# Patient Record
Sex: Male | Born: 1990 | Race: White | Hispanic: No | Marital: Single | Smoking: Never smoker
Health system: Southern US, Community
[De-identification: ages and names within clinical notes are randomized; demographics above are authoritative.]

## PROBLEM LIST (undated history)

## (undated) DIAGNOSIS — F419 Anxiety disorder, unspecified: Secondary | ICD-10-CM

---

## 2015-12-20 ENCOUNTER — Emergency Department (HOSPITAL_COMMUNITY)
Admission: EM | Admit: 2015-12-20 | Discharge: 2015-12-20 | Disposition: A | Discharge status: Home / Self Care | Payer: Self-pay | Attending: Emergency Medicine | Admitting: Emergency Medicine

## 2015-12-20 ENCOUNTER — Encounter (HOSPITAL_COMMUNITY): Payer: Self-pay | Admitting: *Deleted

## 2015-12-20 ENCOUNTER — Emergency Department (HOSPITAL_COMMUNITY): Payer: Self-pay

## 2015-12-20 DIAGNOSIS — N201 Calculus of ureter: Secondary | ICD-10-CM

## 2015-12-20 HISTORY — DX: Anxiety disorder, unspecified: F41.9

## 2015-12-20 MED ORDER — KETOROLAC TROMETHAMINE 30 MG/ML IJ SOLN
30.0000 mg | Freq: Once | INTRAMUSCULAR | Status: AC
  Administered 2015-12-20: 30 mg via INTRAVENOUS
  Filled 2015-12-20: qty 1

## 2015-12-20 MED ORDER — MORPHINE SULFATE (PF) 4 MG/ML IV SOLN
4.0000 mg | Freq: Once | INTRAVENOUS | Status: AC
  Administered 2015-12-20: 4 mg via INTRAVENOUS
  Filled 2015-12-20: qty 1

## 2015-12-20 MED ORDER — ONDANSETRON 4 MG PO TBDP
4.0000 mg | ORAL_TABLET | Freq: Three times a day (TID) | ORAL | 0 refills | Status: AC | PRN
Start: 2015-12-20 — End: ?

## 2015-12-20 MED ORDER — NAPROXEN 500 MG PO TABS: ORAL_TABLET | ORAL | 0 refills | Status: AC

## 2015-12-20 MED ORDER — FENTANYL CITRATE (PF) 100 MCG/2ML IJ SOLN
50.0000 ug | Freq: Once | INTRAMUSCULAR | Status: AC
  Administered 2015-12-20: 50 ug via INTRAVENOUS
  Filled 2015-12-20: qty 2

## 2015-12-20 MED ORDER — METOCLOPRAMIDE HCL 5 MG/ML IJ SOLN
10.0000 mg | Freq: Once | INTRAMUSCULAR | Status: AC
  Administered 2015-12-20: 10 mg via INTRAVENOUS
  Filled 2015-12-20: qty 2

## 2015-12-20 MED ORDER — DIPHENHYDRAMINE HCL 50 MG/ML IJ SOLN
25.0000 mg | Freq: Once | INTRAMUSCULAR | Status: AC
  Administered 2015-12-20: 25 mg via INTRAVENOUS
  Filled 2015-12-20: qty 1

## 2015-12-20 MED ORDER — ONDANSETRON HCL 4 MG/2ML IJ SOLN
4.0000 mg | Freq: Once | INTRAMUSCULAR | Status: AC
  Administered 2015-12-20: 4 mg via INTRAVENOUS

## 2015-12-20 MED ORDER — ONDANSETRON HCL 4 MG/2ML IJ SOLN
INTRAMUSCULAR | Status: AC
  Administered 2015-12-20: 4 mg
  Filled 2015-12-20: qty 2

## 2015-12-20 MED ORDER — ONDANSETRON HCL 4 MG/2ML IJ SOLN
4.0000 mg | Freq: Once | INTRAMUSCULAR | Status: AC
  Administered 2015-12-20: 4 mg via INTRAVENOUS
  Filled 2015-12-20: qty 2

## 2015-12-20 NOTE — ED Notes (Signed)
Victor Soto with CT at desk states pt is asking for pain medication; Dr. Lynelle DoctorKnapp informed and pt's mother informed of wait for order for pain medication

## 2015-12-20 NOTE — ED Triage Notes (Signed)
Pt c/o left flank pain that woke him up from sleep x 1 hour ago; pt states he is having trouble urinating

## 2015-12-20 NOTE — ED Provider Notes (Signed)
[facility]PT Provider Note   CSN: [id number] Arrival date & time: 12/20/15  0433  Time seen 05:20 AM   History   Chief Complaint Chief Complaint  Patient presents with  . Back Pain    HPI Victor Soto is a 25 y.o. male.  HPI patient reports he was awakened from sleep acutely about 3 AM with pain in his left flank area. He describes the pain as "painful" or cramping. He states it hurts with movement and urination. Nothing makes it feel better. He has had nausea and vomiting. He denies hematuria or dysuria. He states sometimes he has pain in his mid abdomen. He states he's never had this pain before. He denies any change in his activity. Mother states his maternal aunt has a history of kidney stones.  [place]  Past Medical History:  Diagnosis Date  . Anxiety     There are no active problems to display for this patient.   History reviewed. No pertinent surgical history.     Home Medications   Anxiety medication that starts with a C or a P Prior to Admission medications   Medication Sig Start Date End Date Taking? Authorizing Provider  naproxen (NAPROSYN) 500 MG tablet Take 1 po BID with food prn pain 12/20/15   Devoria AlbeIva Natelie Ostrosky, MD  ondansetron (ZOFRAN ODT) 4 MG disintegrating tablet Take 1 tablet (4 mg total) by mouth every 8 (eight) hours as needed for nausea or vomiting. 12/20/15   Devoria AlbeIva Shailen Thielen, MD    Family History History reviewed. No pertinent family history.  Social History Social History  Substance Use Topics  . Smoking status: Never Smoker  . Smokeless tobacco: Never Used  . Alcohol use Yes     Comment: occasionally  employed  Allergies   Penicillins   Review of Systems Review of Systems  All other systems reviewed and are negative.    Physical Exam Updated Vital Signs BP 147/97 (BP Location: Left Arm)   Pulse 100   Temp 97.5 F (36.4 C) (Oral)   Resp 18   Ht 5\' 11"  (1.803 m)   SpO2 100%   Vital signs normal    Physical  Exam  Constitutional: He is oriented to person, place, and time. He appears well-developed and well-nourished.  Non-toxic appearance. He does not appear ill. He appears distressed.  HENT:  Head: Normocephalic and atraumatic.  Right Ear: External ear normal.  Left Ear: External ear normal.  Nose: Nose normal. No mucosal edema or rhinorrhea.  Mouth/Throat: Oropharynx is clear and moist and mucous membranes are normal. No dental abscesses or uvula swelling.  Eyes: Conjunctivae and EOM are normal. Pupils are equal, round, and reactive to light.  Neck: Normal range of motion and full passive range of motion without pain. Neck supple.  Cardiovascular: Normal rate, regular rhythm and normal heart sounds.  Exam reveals no gallop and no friction rub.   No murmur heard. Pulmonary/Chest: Effort normal and breath sounds normal. No respiratory distress. He has no wheezes. He has no rhonchi. He has no rales. He exhibits no tenderness and no crepitus.  Abdominal: Soft. Normal appearance and bowel sounds are normal. He exhibits no distension. There is no tenderness. There is no rebound and no guarding.  Genitourinary:  Genitourinary Comments: + Left CVA pain  Musculoskeletal: Normal range of motion. He exhibits no edema or tenderness.  Moves all extremities well.   Neurological: He is alert and oriented to person, place, and time. He has normal  strength. No cranial nerve deficit.  Skin: Skin is warm, dry and intact. No rash noted. No erythema.  Psychiatric: He has a normal mood and affect. His speech is normal and behavior is normal. His mood appears not anxious.  Nursing note and vitals reviewed.    ED Treatments / Results  Labs (all labs ordered are listed, but only abnormal results are displayed) Labs Reviewed - No data to display  EKG  EKG Interpretation None       Radiology Ct Renal Stone Study  Result Date: 12/20/2015 CLINICAL DATA:  Left lower quadrant pain radiating to the left flank.  Difficulty urinating. EXAM: CT ABDOMEN AND PELVIS WITHOUT CONTRAST TECHNIQUE: Multidetector CT imaging of the abdomen and pelvis was performed following the standard protocol without IV contrast. COMPARISON:  None. FINDINGS: Lower chest: No pulmonary nodules or pleural effusion. No visible pericardial effusion. Hepatobiliary: Normal noncontrast appearance of the liver. No visible biliary dilatation. Normal gallbladder. Pancreas: Normal noncontrast appearance of the pancreas. No peripancreatic fluid collection. Spleen: Normal. Adrenal glands: Normal. Urinary Tract: --Right kidney: No hydronephrosis or perinephric stranding. No nephrolithiasis. No obstructing ureteral stones. --Left kidney: There is pelviectasis versus mild hydronephrosis. The left ureter is mildly dilated. No obstructing stone is identified. --Urinary bladder: 2 mm stone within the dependent portion of the urinary bladder may be a recently passed calculus. Stomach/Bowel: No dilated loops of bowel. No evidence of colonic or enteric inflammation. No fluid collection within the abdomen. Vascular/Lymphatic: No abdominal aortic aneurysm or atherosclerotic calcification. No abdominal or pelvic lymphadenopathy. Reproductive: Normal prostate and seminal vesicles. Musculoskeletal. No focal osseous lesion. Normal visualized extraperitoneal and extrathoracic soft tissues. IMPRESSION: 1. 2 mm calculus within the urinary bladder may be a recently passed stone, particularly given the mild left ureteral and renal pelvic dilatation. 2. No other nephrolithiasis. Electronically Signed   By: Deatra RobinsonKevin  Herman M.D.   On: 12/20/2015 06:09    Procedures Procedures (including critical care time)  Medications Ordered in ED Medications  ondansetron (ZOFRAN) 4 MG/2ML injection (4 mg  Given 12/20/15 0536)  ondansetron (ZOFRAN) injection 4 mg (4 mg Intravenous Given 12/20/15 0500)  ondansetron (ZOFRAN) injection 4 mg (4 mg Intravenous Given 12/20/15 0537)  fentaNYL  (SUBLIMAZE) injection 50 mcg (50 mcg Intravenous Given 12/20/15 0536)  metoCLOPramide (REGLAN) injection 10 mg (10 mg Intravenous Given 12/20/15 [id number])  diphenhydrAMINE (BENADRYL) injection 25 mg (25 mg Intravenous Given 12/20/15 0609)  ketorolac (TORADOL) 30 MG/ML injection 30 mg (30 mg Intravenous Given 12/20/15 0608)  morphine 4 MG/ML injection 4 mg (4 mg Intravenous Given 12/20/15 0608)     Initial Impression / Assessment and Plan / ED Course  I have reviewed the triage vital signs and the nursing notes.  Pertinent labs & imaging results that were available during my care of the patient were reviewed by me and considered in my medical decision making (see chart for details).  Clinical Course    Patient had artery had Zofran for nausea, when I was in his room for interview he was still nauseated. He was given another dose of Zofran and given fentanyl for pain. We discussed his pain sounds like a kidney stone and renal CT was ordered.  At 6 AM after patient returned from CT scan nurse reports he still complained of pain and nausea. I look to the CT scanning he does appear to have had a ureteral stone. He was then given Toradol since the stone appears to be low, Reglan and Benadryl for his nausea, and morphine  for pain.  6:15 AM after radiology officially read the CT scan patient and his mother were given the CT results. Patient is wanting to drink water however I had just heard him retching only a few minutes ago. Advised him to wait until his nausea is better controlled.  06:45 AM pain and nausea are gone, we discussed cutting out caffeine drinks (he drinks [facility]), drinking more water and sports drinks. Pt was given a strainer to use to save the stone when he passes it.    Final Clinical Impressions(s) / ED Diagnoses   Final diagnoses:  Left ureteral stone    New Prescriptions New Prescriptions   NAPROXEN (NAPROSYN) 500 MG TABLET    Take 1 po BID with food prn pain   ONDANSETRON  (ZOFRAN ODT) 4 MG DISINTEGRATING TABLET    Take 1 tablet (4 mg total) by mouth every 8 (eight) hours as needed for nausea or vomiting.    Plan discharge  Devoria Albe, MD, Concha Pyo, MD 12/20/15 [phone]

## 2015-12-20 NOTE — Discharge Instructions (Addendum)
Drink plenty of fluids. Avoid caffeine drinks and getting dehydrated, this will make it easier for you to form more kidney stones. Right now you don't have any other stones in your kidneys, just the one you passed today. You can save the stone when you pass it to have it sent off to see what thinks you should avoid in your diet. You can take the naproxen if you have some soreness and the zofran if you have any more nausea or vomiting, but most people don't after they pass their stone.

## 2017-04-01 DIAGNOSIS — K5792 Diverticulitis of intestine, part unspecified, without perforation or abscess without bleeding: Secondary | ICD-10-CM | POA: Diagnosis not present

## 2017-04-01 DIAGNOSIS — Z6822 Body mass index (BMI) 22.0-22.9, adult: Secondary | ICD-10-CM | POA: Diagnosis not present

## 2017-11-14 DIAGNOSIS — Z23 Encounter for immunization: Secondary | ICD-10-CM | POA: Diagnosis not present

## 2017-11-14 DIAGNOSIS — Z6821 Body mass index (BMI) 21.0-21.9, adult: Secondary | ICD-10-CM | POA: Diagnosis not present

## 2017-11-14 DIAGNOSIS — A084 Viral intestinal infection, unspecified: Secondary | ICD-10-CM | POA: Diagnosis not present

## 2018-06-11 IMAGING — CT CT RENAL STONE PROTOCOL
2 of 4 series · 16 of 46 positions shown, 18 images · non-contrast
Comparison: None.

CLINICAL DATA: Left lower quadrant pain radiating to the left
flank. Difficulty urinating.

EXAM:
CT ABDOMEN AND PELVIS WITHOUT CONTRAST
TECHNIQUE: Multidetector CT imaging of the abdomen and pelvis was performed
following the standard protocol without IV contrast.

[Series 2: axial st · axial · 0.67mm/px · z∈[+666,+1060]mm · 13 of 87 slices shown, 15 images]
[im 4/87  soft-tissue]
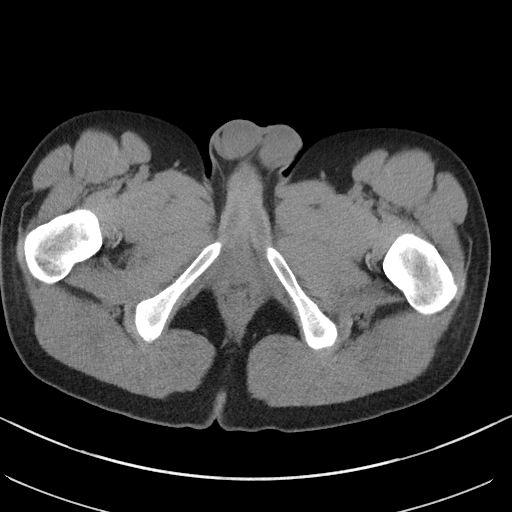
[im 4/87  bone]
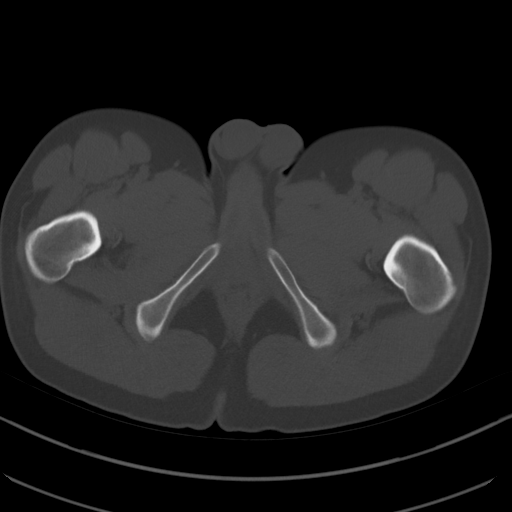
[im 11/87  soft-tissue]
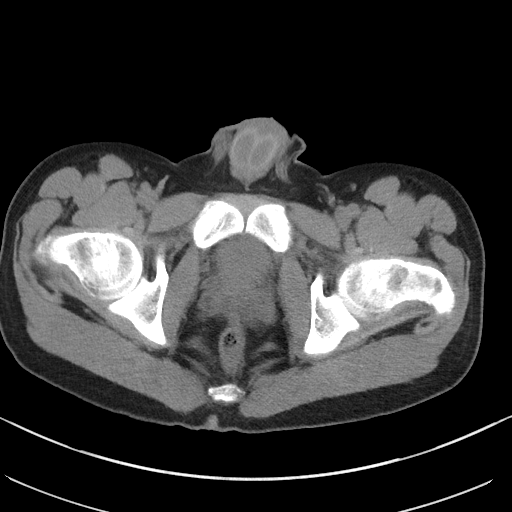
[im 18/87  soft-tissue]
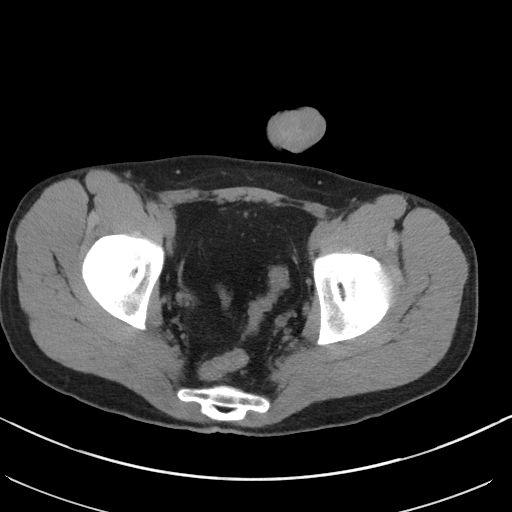
[im 26/87  soft-tissue]
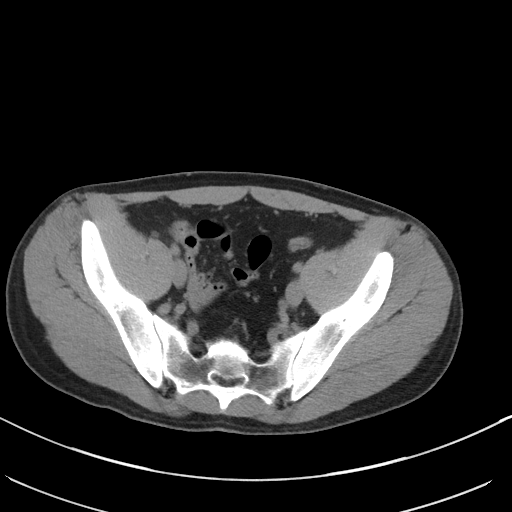
[im 29/87  soft-tissue]
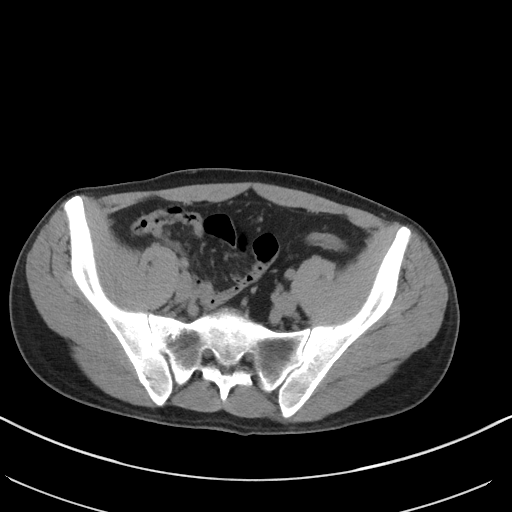
[im 36/87  soft-tissue]
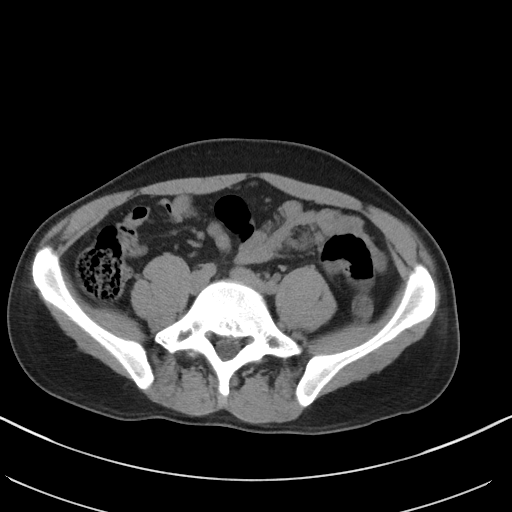
[im 44/87  soft-tissue]
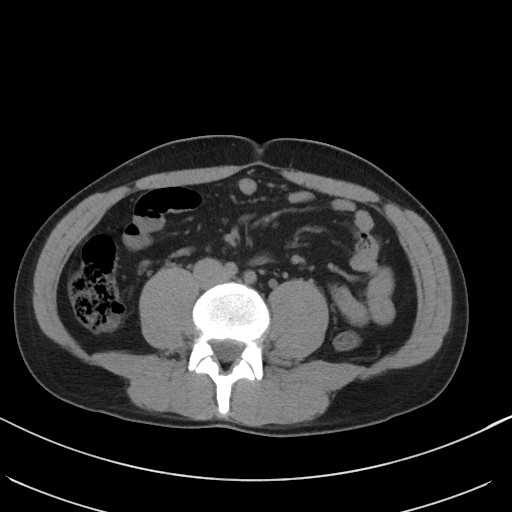
[im 51/87  soft-tissue]
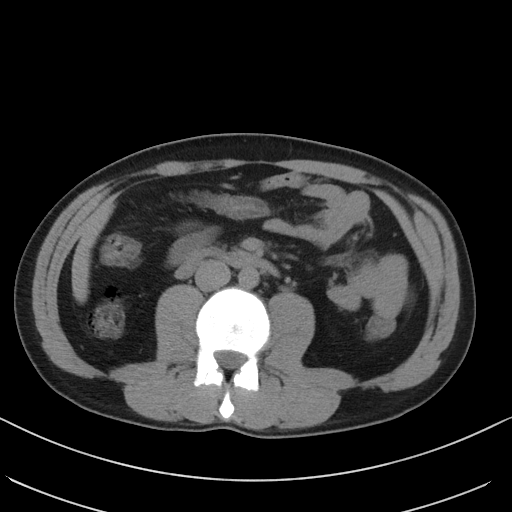
[im 58/87  soft-tissue]
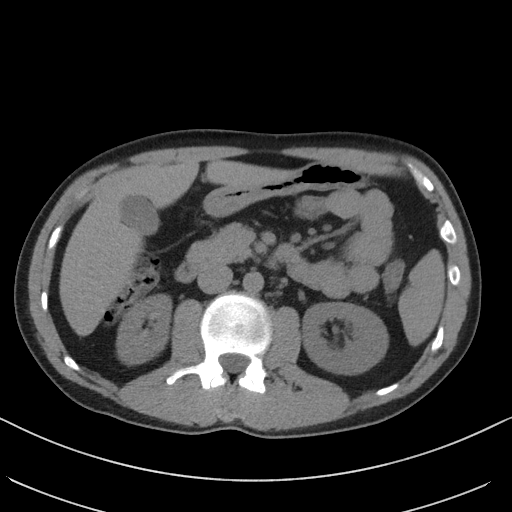
[im 58/87  bone]
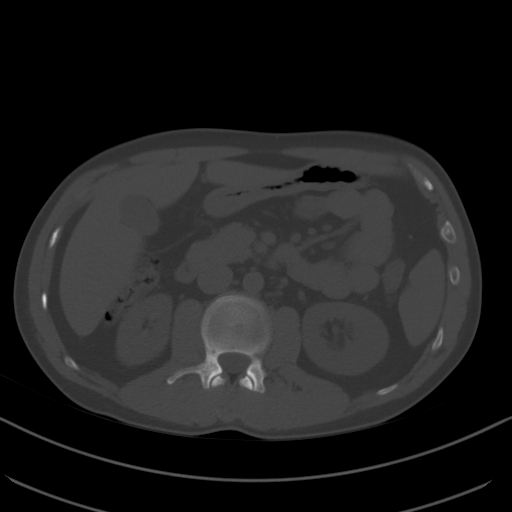
[im 61/87  soft-tissue]
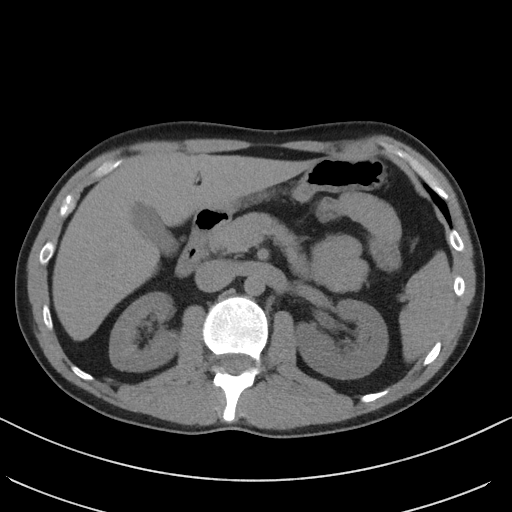
[im 69/87  soft-tissue]
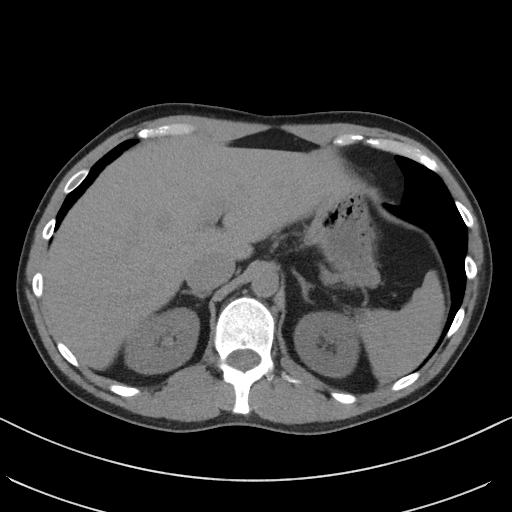
[im 76/87  soft-tissue]
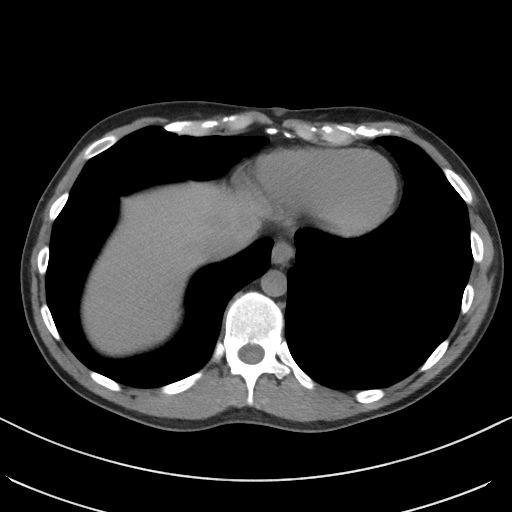
[im 83/87  soft-tissue]
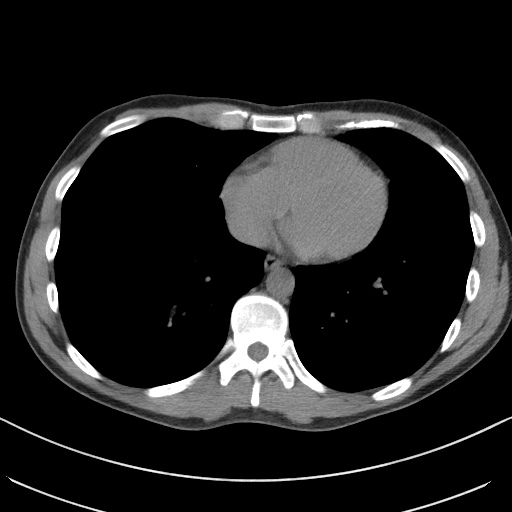

[Series 3: coronal st · coronal · 0.70mm/px · 3 of 97 slices shown]
[im 33/97  soft-tissue]
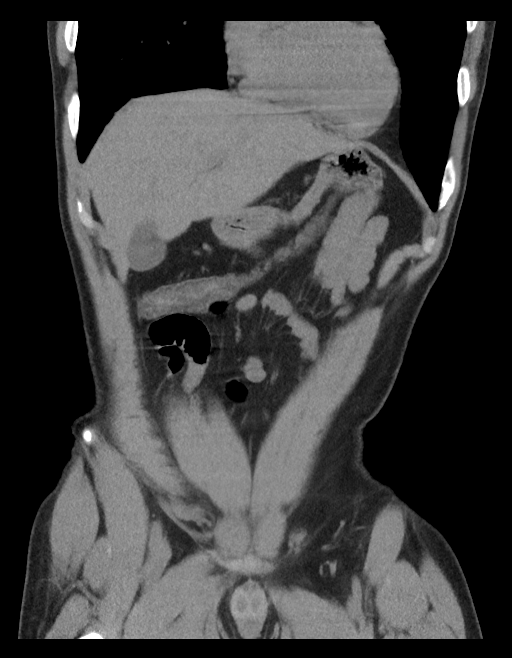
[im 43/97  soft-tissue]
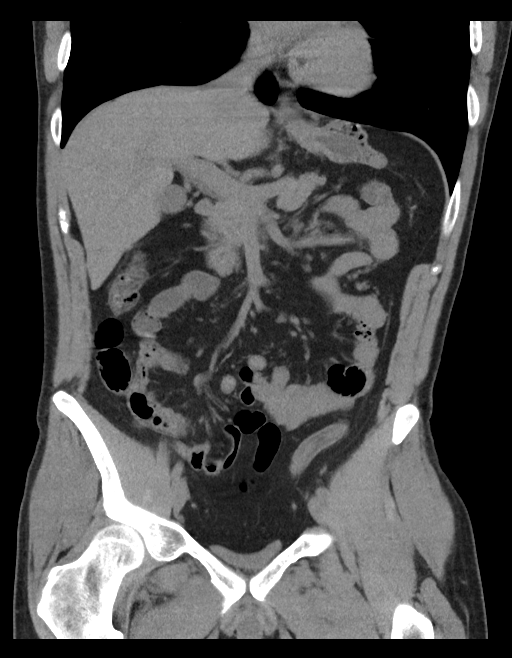
[im 54/97  soft-tissue]
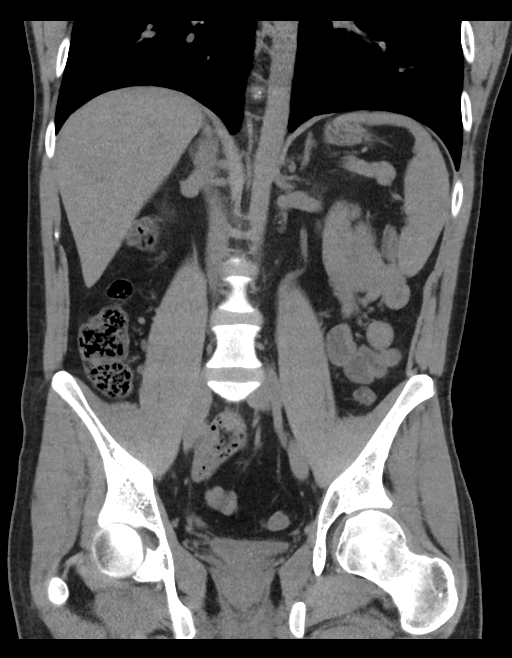

[16 of 46 positions shown; findings below may reference images not displayed]

FINDINGS: Lower chest: No pulmonary nodules or pleural effusion. No visible
pericardial effusion.

Hepatobiliary: Normal noncontrast appearance of the liver. No
visible biliary dilatation. Normal gallbladder.

Pancreas: Normal noncontrast appearance of the pancreas. No
peripancreatic fluid collection.

Spleen: Normal.

Adrenal glands: Normal.

Urinary Tract:

--Right kidney: No hydronephrosis or perinephric stranding. No
nephrolithiasis. No obstructing ureteral stones.

--Left kidney: There is pelviectasis versus mild hydronephrosis. The
left ureter is mildly dilated. No obstructing stone is identified.

--Urinary bladder: 2 mm stone within the dependent portion of the
urinary bladder may be a recently passed calculus.

Stomach/Bowel: No dilated loops of bowel. No evidence of colonic or
enteric inflammation. No fluid collection within the abdomen.

Vascular/Lymphatic: No abdominal aortic aneurysm or atherosclerotic
calcification. No abdominal or pelvic lymphadenopathy.

Reproductive: Normal prostate and seminal vesicles.

Musculoskeletal. No focal osseous lesion. Normal visualized
extraperitoneal and extrathoracic soft tissues.
IMPRESSION: 1. 2 mm calculus within the urinary bladder may be a recently passed
stone, particularly given the mild left ureteral and renal pelvic
dilatation.
2. No other nephrolithiasis.

## 2018-11-21 ENCOUNTER — Other Ambulatory Visit: Payer: Self-pay

## 2018-11-21 DIAGNOSIS — Z20822 Contact with and (suspected) exposure to covid-19: Secondary | ICD-10-CM

## 2018-11-23 LAB — NOVEL CORONAVIRUS, NAA: SARS-CoV-2, NAA: NOT DETECTED
